# Patient Record
Sex: Male | Born: 1995 | Race: White | Hispanic: No | Marital: Single
Health system: Southern US, Community
[De-identification: ages and names within clinical notes are randomized; demographics above are authoritative.]

---

## 2021-06-25 ENCOUNTER — Emergency Department (HOSPITAL_COMMUNITY): Payer: PRIVATE HEALTH INSURANCE

## 2021-06-25 ENCOUNTER — Emergency Department (HOSPITAL_COMMUNITY)
Admission: EM | Admit: 2021-06-25 | Discharge: 2021-06-25 | Disposition: A | Discharge status: Home / Self Care | Payer: PRIVATE HEALTH INSURANCE | Attending: Emergency Medicine | Admitting: Emergency Medicine

## 2021-06-25 ENCOUNTER — Encounter (HOSPITAL_COMMUNITY): Payer: Self-pay | Admitting: Emergency Medicine

## 2021-06-25 DIAGNOSIS — S60453A Superficial foreign body of left middle finger, initial encounter: Secondary | ICD-10-CM | POA: Insufficient documentation

## 2021-06-25 DIAGNOSIS — Y990 Civilian activity done for income or pay: Secondary | ICD-10-CM | POA: Insufficient documentation

## 2021-06-25 DIAGNOSIS — S6992XA Unspecified injury of left wrist, hand and finger(s), initial encounter: Secondary | ICD-10-CM

## 2021-06-25 DIAGNOSIS — Z23 Encounter for immunization: Secondary | ICD-10-CM | POA: Insufficient documentation

## 2021-06-25 DIAGNOSIS — W458XXA Other foreign body or object entering through skin, initial encounter: Secondary | ICD-10-CM | POA: Insufficient documentation

## 2021-06-25 MED ORDER — DOXYCYCLINE HYCLATE 100 MG PO TABS
100.0000 mg | ORAL_TABLET | Freq: Once | ORAL | Status: AC
  Administered 2021-06-25: 100 mg via ORAL
  Filled 2021-06-25: qty 1

## 2021-06-25 MED ORDER — DOXYCYCLINE HYCLATE 100 MG PO CAPS: 100.0000 mg | ORAL_CAPSULE | Freq: Two times a day (BID) | ORAL | 0 refills | Status: AC

## 2021-06-25 MED ORDER — LIDOCAINE HCL (PF) 1 % IJ SOLN
15.0000 mL | Freq: Once | INTRAMUSCULAR | Status: AC
  Administered 2021-06-25: 15 mL via INTRADERMAL
  Filled 2021-06-25: qty 30

## 2021-06-25 MED ORDER — TETANUS-DIPHTH-ACELL PERTUSSIS 5-2.5-18.5 LF-MCG/0.5 IM SUSY
0.5000 mL | PREFILLED_SYRINGE | Freq: Once | INTRAMUSCULAR | Status: AC
  Administered 2021-06-25: 0.5 mL via INTRAMUSCULAR
  Filled 2021-06-25: qty 0.5

## 2021-06-25 NOTE — ED Triage Notes (Signed)
Per patient, states he was at work-accidentally screwed a screw in left pointer finger- ?

## 2021-06-25 NOTE — ED Notes (Signed)
I provided reinforced discharge education based off of discharge instructions. Pt acknowledged and understood my education. Pt had no further questions/concerns for provider/myself.  °

## 2021-06-25 NOTE — Discharge Instructions (Addendum)
You were seen here today for evaluation of your finger injury. We have updated your tetanus while you were here and you were given your first dose of your antibiotic. Please clean the wound twice daily with Dial soap and water. You can wear the finger splint at work for a few days for further protection.  ? ?Contact a doctor if: ?You got a tetanus shot and you have any of these problems where the needle went in: ?Swelling. ?Very bad pain. ?Redness. ?Bleeding. ?A wound that was closed breaks open. ?You have a fever. ?You have any of these signs of infection in your wound: ?More redness, swelling, or pain. ?Fluid or blood. ?Warmth. ?Pus or a bad smell. ?You see something coming out of the wound, such as wood or glass. ?Medicine does not make your pain go away. ?You notice a change in the color of your skin near your wound. ?You need to change the bandage often. ?You have a new rash. ?You lose feeling (have numbness) around the wound. ?Get help right away if: ?You have very bad swelling around the wound. ?Your pain suddenly gets worse and is very bad. ?You have painful lumps near the wound or on skin anywhere on your body. ?You have a red streak going away from your wound. ?The wound is on your hand or foot, and: ?You cannot move a finger or toe. ?Your fingers or toes look pale or bluish. ?

## 2021-06-25 NOTE — ED Provider Notes (Signed)
?[facility]-EMERGENCY DEPT ?Provider Note ? ? ?CSN: [id number] ?Arrival date & time: 06/25/21  1225 ? ?  ? ?History ?Chief Complaint  ?Patient presents with  ? Finger Injury  ? ? ?Curtis Myers is a 26 y.o. male otherwise healthy presents tot he ED for evaluation of left middle finger injury. Just prior to arrival, the patient was at work and was drilling in a piece of wood at an angle he could not see. It was a power drill and he drilled through the board and then through his finger. He does not remember when his tetanus was last updated. Denies any allergies to any medications.  ?  ? ?HPI ? ?  ? ?Home Medications ?Prior to Admission medications   ?Not on File  ?   ? ?Allergies    ?Patient has no allergy information on record.   ? ?Review of Systems   ?Review of Systems  ?Skin:  Positive for wound.  ? ?Physical Exam ?Updated Vital Signs ?BP 120/80 (BP Location: Right Arm)   Pulse 78   Temp 98 ?F (36.7 ?C) (Oral)   Resp 18   SpO2 94%  ?Physical Exam ?Vitals and nursing note reviewed.  ?Constitutional:   ?   Appearance: Normal appearance.  ?Eyes:  ?   General: No scleral icterus. ?Pulmonary:  ?   Effort: Pulmonary effort is normal. No respiratory distress.  ?Skin: ?   General: Skin is dry.  ?   Findings: No rash.  ?   Comments: Obvious foreign body embedded in the distal left middle finger through and through. No bleeding. No nail involvement. Calloused palms. Pulses strong. Exam limited secondary to the board. Please see picture.   ?Neurological:  ?   General: No focal deficit present.  ?   Mental Status: He is alert. Mental status is at baseline.  ?Psychiatric:     ?   Mood and Affect: Mood normal.  ? ? ? ? ? ? ? ?ED Results / Procedures / Treatments   ?Labs ?(all labs ordered are listed, but only abnormal results are displayed) ?Labs Reviewed - No data to display ? ?EKG ?None ? ?Radiology ?DG Finger Middle Left ? ?Result Date: 06/25/2021 ?CLINICAL DATA:  Screw imbedded in LEFT middle finger  EXAM: LEFT MIDDLE FINGER 2+V COMPARISON:  None FINDINGS: Large metallic foreign body consistent with a screw identified traversing the soft tissues at the volar aspect of distal phalanx LEFT middle finger. Associated large piece of wood external to the middle finger with minimal metallic debris. No acute fracture, dislocation, or bone destruction. IMPRESSION: Large metallic foreign body (screw) within soft tissues at distal phalanx LEFT middle finger. No acute osseous abnormalities. Electronically Signed   By: Ulyses Southward M.D.   On: 06/25/2021 14:37   ? ?Procedures ?Marland KitchenForeign Body Removal ? ?Date/Time: 06/25/2021 3:40 PM ?Performed by: Achille Rich, PA-C ?Authorized by: Achille Rich, PA-C  ?Consent: Verbal consent obtained. ?Risks and benefits: risks, benefits and alternatives were discussed ?Consent given by: patient ?Patient understanding: patient states understanding of the procedure being performed ?Imaging studies: imaging studies available ?Patient identity confirmed: verbally with patient ?Body area: skin ?General location: upper extremity ?Location details: left long finger ?Anesthesia: digital block ? ?Anesthesia: ?Local Anesthetic: lidocaine 1% without epinephrine ?Anesthetic total: 7 mL ? ?Sedation: ?Patient sedated: no ? ?Patient restrained: no ?Patient cooperative: yes ?Removal mechanism: Drill. ?Post-procedure assessment: foreign body removed ?Comments: A digital block was placed and paresthesia was confirmed. The patient had a drill and  matching drill bit with him. He unscrewed the screw from his finger. Some blood loss after the removal of the screw, but hemostasis obtained with direct pressure.  ?  ? ? ?Medications Ordered in ED ?Medications  ?Tdap (BOOSTRIX) injection 0.5 mL (0.5 mLs Intramuscular Given 06/25/21 1444)  ?lidocaine (PF) (XYLOCAINE) 1 % injection 15 mL (15 mLs Intradermal Given 06/25/21 1446)  ?doxycycline (VIBRA-TABS) tablet 100 mg (100 mg Oral Given 06/25/21 1606)  ? ? ?ED Course/  Medical Decision Making/ A&P ?  ?                        ?Medical Decision Making ?Amount and/or Complexity of Data Reviewed ?Radiology: ordered. ? ?Risk ?Prescription drug management. ? ? ?26 y/o M presents to the ED for evaluation of his finger. Possible fracture vs. Foreign body removal. Vital signs are stabe. Physical exam pertinent for obvious foreign body embedded in the distal left middle finger through and through. No bleeding. No nail involvement. Calloused palms. Pulses strong. Exam limited secondary to the board. Please see picture. ? ?I independently reviewed and interpreted the patient's imaging and agree with the radiologist's interpretations. Left hand XR shows large metallic foreign body (screw) within soft tissues at distal phalanx LEFT middle finger. No acute osseous abnormalities. ? ?Tetanus was updated and the patient was given his first dose of antibiotic while in the ER. He declined any pain medication. ? ?Please see procedure note. Screw was removed using the patient's drill. Minimal bleeding afterwards which was controlled after direct pressure. The wound was thoroughly cleaned with saline and betadine soak and then cleaned again by nursing staff. Aluminum splint placed. The patient has good cap refill and had full ROM of the finger still. Unable to assess sensation at the distal end as the patient has a digital block. ? ?Wound care discussed with the patient. Stressed the importance of completing the antibiotic. Recommended Tylenol/ibuprofen as needed for pain. Strict return precautions discussed. The patient agrees to plan. The patient is stable and being sent home in good condition.  ? ?I discussed this case with my attending physician who cosigned this note including patient's presenting symptoms, physical exam, and planned diagnostics and interventions. Attending physician stated agreement with plan or made changes to plan which were implemented.  ? ?Attending physician assessed patient  at bedside. ? ?Final Clinical Impression(s) / ED Diagnoses ?Final diagnoses:  ?Injury of finger of left hand, initial encounter  ? ? ?Rx / DC Orders ?ED Discharge Orders   ? ?      Ordered  ?  doxycycline (VIBRAMYCIN) 100 MG capsule  2 times daily       ? 06/25/21 1602  ? ?  ?  ? ?  ? ? ?  ?Achille Rich, PA-C ?06/26/21 1953 ? ?  ?Ernie Avena, MD ?06/26/21 2050 ? ?

## 2023-03-07 IMAGING — DX DG FINGER MIDDLE 2+V*L*
3 series · 3 of 3 positions shown · non-contrast
Comparison: None

CLINICAL DATA: Screw imbedded in LEFT middle finger

EXAM:
LEFT MIDDLE FINGER 2+V

[finger lat]
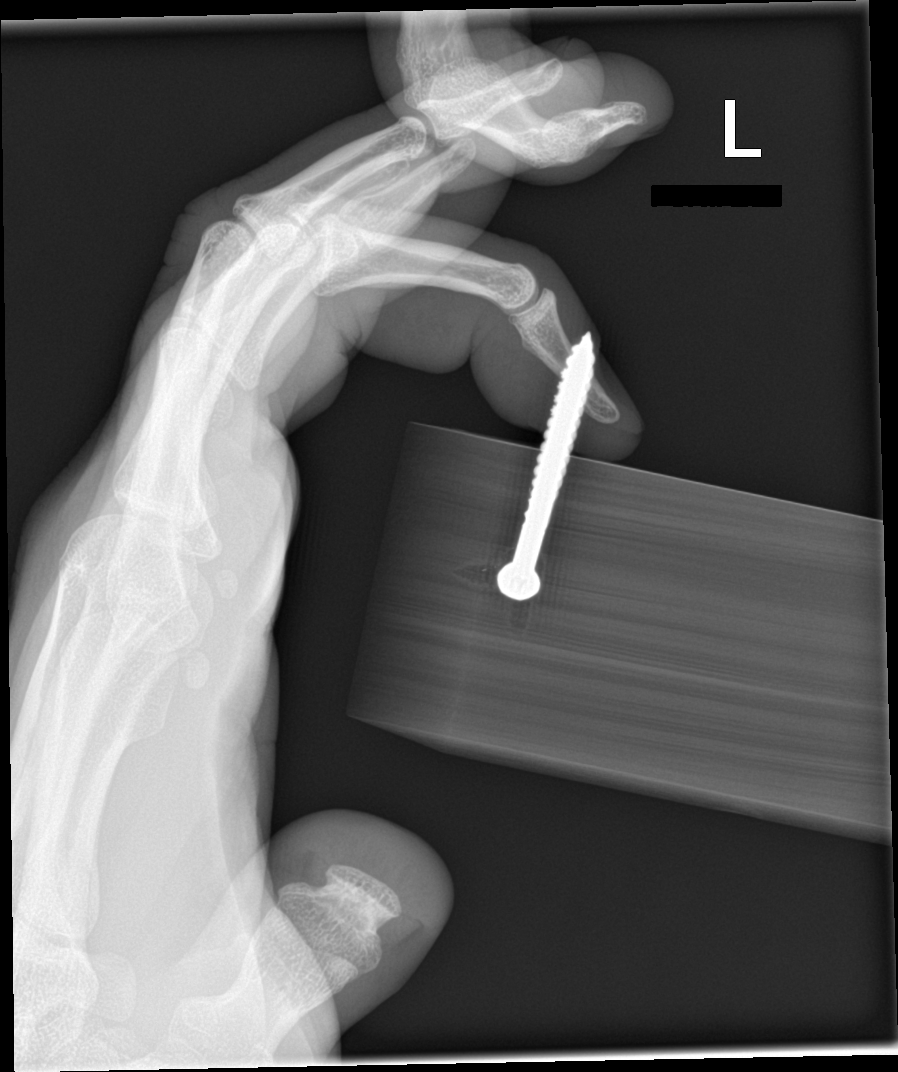

[finger ap]
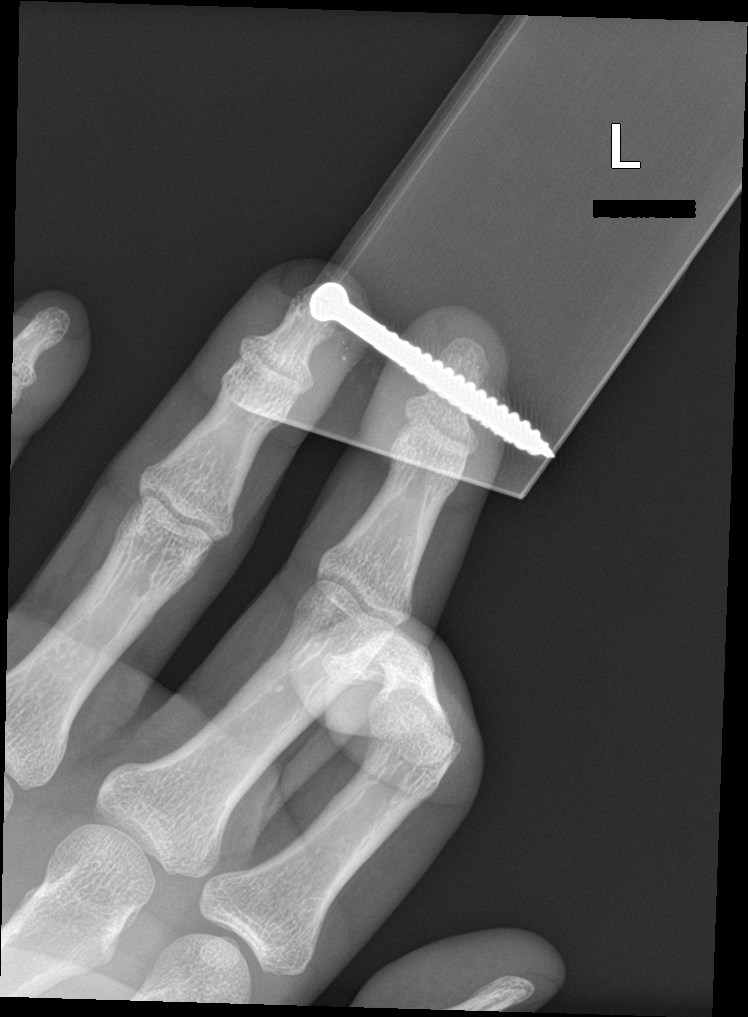

[finger obl]
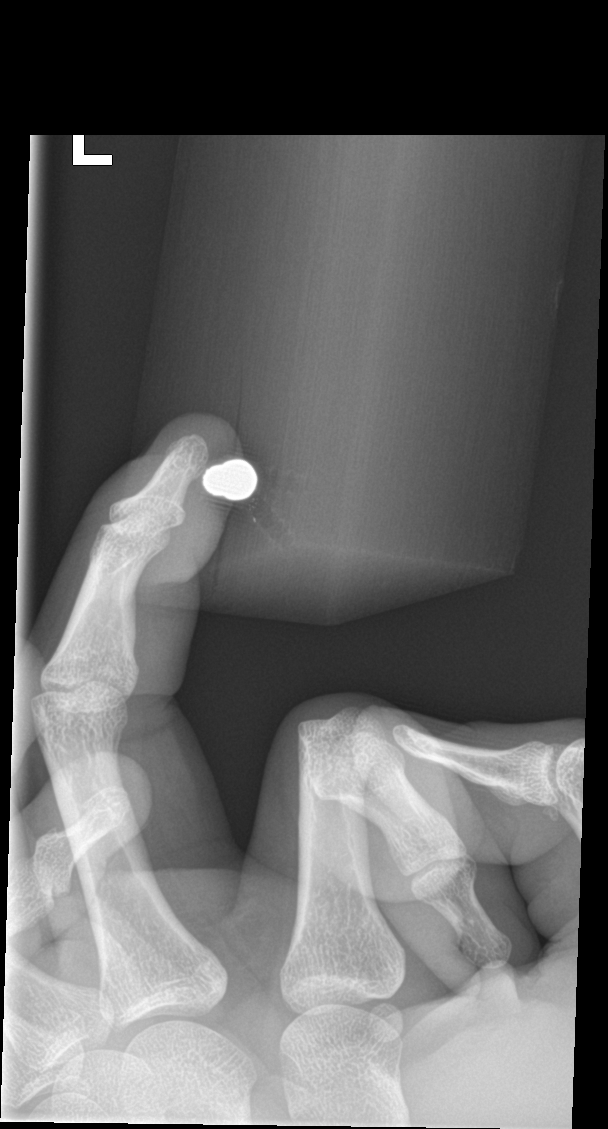

[3 of 3 positions shown; findings below may reference images not displayed]

FINDINGS: Large metallic foreign body consistent with a screw identified
traversing the soft tissues at the volar aspect of distal phalanx
LEFT middle finger.

Associated large piece of [place] external to the middle finger with
minimal metallic debris.

No acute fracture, dislocation, or bone destruction.
IMPRESSION: Large metallic foreign body (screw) within soft tissues at distal
phalanx LEFT middle finger.

No acute osseous abnormalities.
